# Patient Record
Sex: Female | Born: 1950 | Race: Black or African American | Hispanic: No | Marital: Single | Smoking: Never smoker
Health system: Southern US, Community
[De-identification: ages and names within clinical notes are randomized; demographics above are authoritative.]

## PROBLEM LIST (undated history)

## (undated) DIAGNOSIS — I10 Essential (primary) hypertension: Secondary | ICD-10-CM

## (undated) DIAGNOSIS — E669 Obesity, unspecified: Secondary | ICD-10-CM

## (undated) HISTORY — PX: CORNEAL TRANSPLANT: SHX108

---

## 1998-04-04 ENCOUNTER — Ambulatory Visit (HOSPITAL_COMMUNITY): Admission: RE | Admit: 1998-04-04 | Discharge: 1998-04-04 | Payer: Self-pay | Admitting: Internal Medicine

## 2010-10-12 ENCOUNTER — Encounter: Payer: Self-pay | Admitting: Internal Medicine

## 2013-02-13 ENCOUNTER — Encounter (HOSPITAL_COMMUNITY): Payer: Self-pay

## 2013-02-13 ENCOUNTER — Emergency Department (HOSPITAL_COMMUNITY)
Admission: EM | Admit: 2013-02-13 | Discharge: 2013-02-13 | Disposition: A | Discharge status: Nursing Home (Includes ICF) | Payer: BC Managed Care – PPO | Source: Home / Self Care

## 2013-02-13 ENCOUNTER — Emergency Department (HOSPITAL_COMMUNITY): Payer: BC Managed Care – PPO

## 2013-02-13 ENCOUNTER — Encounter (HOSPITAL_COMMUNITY): Payer: Self-pay | Admitting: *Deleted

## 2013-02-13 ENCOUNTER — Emergency Department (HOSPITAL_COMMUNITY)
Admission: EM | Admit: 2013-02-13 | Discharge: 2013-02-13 | Disposition: A | Discharge status: Home / Self Care | Payer: BC Managed Care – PPO | Attending: Emergency Medicine | Admitting: Emergency Medicine

## 2013-02-13 DIAGNOSIS — I10 Essential (primary) hypertension: Secondary | ICD-10-CM

## 2013-02-13 DIAGNOSIS — I160 Hypertensive urgency: Secondary | ICD-10-CM

## 2013-02-13 DIAGNOSIS — Z7982 Long term (current) use of aspirin: Secondary | ICD-10-CM | POA: Insufficient documentation

## 2013-02-13 DIAGNOSIS — Z885 Allergy status to narcotic agent status: Secondary | ICD-10-CM | POA: Insufficient documentation

## 2013-02-13 DIAGNOSIS — I4510 Unspecified right bundle-branch block: Secondary | ICD-10-CM

## 2013-02-13 DIAGNOSIS — N390 Urinary tract infection, site not specified: Secondary | ICD-10-CM | POA: Insufficient documentation

## 2013-02-13 DIAGNOSIS — E669 Obesity, unspecified: Secondary | ICD-10-CM | POA: Insufficient documentation

## 2013-02-13 HISTORY — DX: Obesity, unspecified: E66.9

## 2013-02-13 HISTORY — DX: Essential (primary) hypertension: I10

## 2013-02-13 LAB — URINALYSIS, ROUTINE W REFLEX MICROSCOPIC
Bilirubin Urine: NEGATIVE
Glucose, UA: NEGATIVE mg/dL
Hgb urine dipstick: NEGATIVE
Ketones, ur: 40 mg/dL — AB
Nitrite: NEGATIVE
Protein, ur: NEGATIVE mg/dL
Specific Gravity, Urine: 1.015 (ref 1.005–1.030)
Urobilinogen, UA: 1 mg/dL (ref 0.0–1.0)
pH: 6.5 (ref 5.0–8.0)

## 2013-02-13 LAB — BASIC METABOLIC PANEL
BUN: 8 mg/dL (ref 6–23)
CO2: 29 mEq/L (ref 19–32)
Calcium: 9.6 mg/dL (ref 8.4–10.5)
Chloride: 101 mEq/L (ref 96–112)
Creatinine, Ser: 0.79 mg/dL (ref 0.50–1.10)
GFR calc Af Amer: >90 mL/min (ref >90)
GFR calc non Af Amer: 87 mL/min — ABNORMAL LOW (ref >90)
Glucose, Bld: 101 mg/dL — ABNORMAL HIGH (ref 70–99)
Potassium: 3.7 mEq/L (ref 3.5–5.1)
Sodium: 141 mEq/L (ref 135–145)

## 2013-02-13 LAB — URINE MICROSCOPIC-ADD ON

## 2013-02-13 LAB — CBC WITH DIFFERENTIAL/PLATELET
Basophils Absolute: 0 10*3/uL (ref 0.0–0.1)
Basophils Relative: 0 % (ref 0–1)
Eosinophils Absolute: 0 10*3/uL (ref 0.0–0.7)
Eosinophils Relative: 0 % (ref 0–5)
HCT: 45.4 % (ref 36.0–46.0)
Hemoglobin: 15.5 g/dL — ABNORMAL HIGH (ref 12.0–15.0)
Lymphocytes Relative: 14 % (ref 12–46)
Lymphs Abs: 1 10*3/uL (ref 0.7–4.0)
MCH: 29.5 pg (ref 26.0–34.0)
MCHC: 34.1 g/dL (ref 30.0–36.0)
MCV: 86.3 fL (ref 78.0–100.0)
Monocytes Absolute: 0.5 10*3/uL (ref 0.1–1.0)
Monocytes Relative: 7 % (ref 3–12)
Neutro Abs: 5.5 10*3/uL (ref 1.7–7.7)
Neutrophils Relative %: 79 % — ABNORMAL HIGH (ref 43–77)
Platelets: 327 10*3/uL (ref 150–400)
RBC: 5.26 MIL/uL — ABNORMAL HIGH (ref 3.87–5.11)
RDW: 15.1 % (ref 11.5–15.5)
WBC: 6.9 10*3/uL (ref 4.0–10.5)

## 2013-02-13 MED ORDER — CIPROFLOXACIN HCL 250 MG PO TABS: 250.0000 mg | ORAL_TABLET | Freq: Two times a day (BID) | ORAL | Status: DC

## 2013-02-13 MED ORDER — CLONIDINE HCL 0.1 MG PO TABS
ORAL_TABLET | ORAL | Status: AC
  Filled 2013-02-13: qty 2

## 2013-02-13 MED ORDER — CLONIDINE HCL 0.1 MG PO TABS
0.2000 mg | ORAL_TABLET | Freq: Once | ORAL | Status: AC
  Administered 2013-02-13: 0.2 mg via ORAL

## 2013-02-13 MED ORDER — LISINOPRIL-HYDROCHLOROTHIAZIDE 20-12.5 MG PO TABS: 1.0000 | ORAL_TABLET | Freq: Every day | ORAL | Status: DC

## 2013-02-13 MED ORDER — SODIUM CHLORIDE 0.9 % IV SOLN
Freq: Once | INTRAVENOUS | Status: AC
  Administered 2013-02-13: 15:00:00 via INTRAVENOUS

## 2013-02-13 NOTE — ED Notes (Signed)
Report given to [facility]. 

## 2013-02-13 NOTE — ED Provider Notes (Signed)
Medical screening examination/treatment/procedure(s) were conducted as a shared visit with non-physician practitioner(s) and myself.  I personally evaluated the patient during the encounter.  No evidence of HTN emergency.  Will start BP Rx  Donnetta Hutching, MD 02/13/13 2351

## 2013-02-13 NOTE — ED Provider Notes (Signed)
History     CSN: [id number]  Arrival date & time 02/13/13  1517   First MD Initiated Contact with Patient 02/13/13 1531      Chief Complaint  Patient presents with  . Hypertension    (Consider location/radiation/quality/duration/timing/severity/associated sxs/prior treatment) HPI Patient presents to the emergency department with elevated blood pressure.  Patient, states she is not taking her blood pressure medicines at this time.  She states she had stopped taking them about one year ago.patient denies chest pain, shortness of breath, nausea, vomiting, dysuria, decreased urination, blurred vision, headache, fever, numbness, or weakness.  Patient, states, that she does have a primary care Dr. Patient denies any worsening or alleviating factors to her blood pressure.  Patient, states, that she's had some abdominal fullness, that is relieved with bowel movements in the past few weeks. Past Medical History  Diagnosis Date  . Hypertension   . Obesity     Past Surgical History  Procedure Laterality Date  . Corneal transplant      No family history on file.  History  Substance Use Topics  . Smoking status: Never Smoker   . Smokeless tobacco: Not on file  . Alcohol Use: No    OB History   Grav Para Term Preterm Abortions TAB SAB Ect Mult Living                  Review of Systems All other systems negative except as documented in the HPI. All pertinent positives and negatives as reviewed in the HPI. Allergies  Codeine  Home Medications   Current Outpatient Rx  Name  Route  Sig  Dispense  Refill  . aspirin 81 MG chewable tablet   Oral   Chew 81 mg by mouth daily.         Marland Kitchen PRESCRIPTION MEDICATION      Supposed to be taking lisinopril-HCTZ 20/12.5mg  (per [facility]); has not taken in > 1 yr           BP 217/80  Pulse 91  Temp(Src) 98.7 F (37.1 C) (Oral)  Resp 20  Ht 5\' 5"  (1.651 m)  Wt 295 lb (133.811 kg)  BMI 49.09 kg/m2  SpO2 99%  Physical Exam   Nursing note and vitals reviewed. Constitutional: She is oriented to person, place, and time. She appears well-developed and well-nourished. No distress.  HENT:  Head: Normocephalic and atraumatic.  Mouth/Throat: Oropharynx is clear and moist.  Eyes: Pupils are equal, round, and reactive to light.  Neck: Normal range of motion. Neck supple.  Cardiovascular: Normal rate, regular rhythm and normal heart sounds.  Exam reveals no gallop and no friction rub.   No murmur heard. Pulmonary/Chest: Effort normal and breath sounds normal. No respiratory distress.  Abdominal: Soft. Bowel sounds are normal. She exhibits no distension and no mass. There is no tenderness. There is no guarding.  Musculoskeletal: Normal range of motion.  Neurological: She is alert and oriented to person, place, and time. She exhibits normal muscle tone. Coordination normal.  Skin: Skin is warm and dry. No rash noted. No erythema.  Psychiatric: She has a normal mood and affect. Her behavior is normal. Judgment and thought content normal.    ED Course  Procedures (including critical care time)  Labs Reviewed  CBC WITH DIFFERENTIAL - Abnormal; Notable for the following:    RBC 5.26 (*)    Hemoglobin 15.5 (*)    Neutrophils Relative % 79 (*)    All other components within normal limits  BASIC METABOLIC PANEL - Abnormal; Notable for the following:    Glucose, Bld 101 (*)    GFR calc non Af Amer 87 (*)    All other components within normal limits  URINALYSIS, ROUTINE W REFLEX MICROSCOPIC - Abnormal; Notable for the following:    APPearance CLOUDY (*)    Ketones, ur 40 (*)    Leukocytes, UA LARGE (*)    All other components within normal limits  URINE MICROSCOPIC-ADD ON - Abnormal; Notable for the following:    Bacteria, UA FEW (*)    All other components within normal limits  URINE CULTURE   Dg Chest 2 View  02/13/2013   *RADIOLOGY REPORT*  Clinical Data: Current history of hypertension for which the patient has  run out of medications.  CHEST - 2 VIEW  Comparison: None.  Findings: Cardiac silhouette mildly to moderately enlarged.  Hilar and mediastinal contours otherwise unremarkable.  Mildly prominent bronchovascular markings diffusely and moderate central peribronchial thickening.  No confluent airspace consolidation. Normal pulmonary vascularity.  No pleural effusions.  Visualized bony thorax intact.  IMPRESSION: Mild to moderate cardiomegaly without pulmonary edema.  Moderate changes of bronchitis and/or asthma which may be acute or chronic.   Original Report Authenticated By: Hulan Saas, M.D.     Patient has no signs of hypertensive crisis or emergency. she will be treated for her blood pressure.  She is advised, that she'll need to followup with her primary care Dr.patient is advised to return here for any worsening in her condition.  Advised her we will treat for mild UTI.  Told to increase her fluid intake   MDM  MDM Reviewed: vitals and nursing note Interpretation: labs, ECG and x-ray   Date: 02/13/2013  Rate: 104  Rhythm: normal sinus rhythm  QRS Axis: normal  Intervals: normal  ST/T Wave abnormalities: normal  Conduction Disutrbances:right bundle branch block  Narrative Interpretation:   Old EKG Reviewed: unchanged            Carlyle Dolly, PA-C 02/13/13 1815

## 2013-02-13 NOTE — ED Notes (Addendum)
Patient arrived [facility] from [facility]. Pt presented to Urgent Care today with concerns due to not taking her medications for an extended amount of time. (approx 1 year or more) Pt reports feeling funny on Thursday (4 days ago). Patient denies CP, SOB, Nausea/Vomiting. Pt endorses "a full feeling in my stomach. I just knew it was BP related". Edema in feet.

## 2013-02-13 NOTE — ED Notes (Signed)
Patient is resting comfortably. 

## 2013-02-13 NOTE — ED Notes (Signed)
Patient transported to X-ray 

## 2013-02-13 NOTE — ED Provider Notes (Signed)
History     CSN: [id number]  Arrival date & time 02/13/13  1217   First MD Initiated Contact with Patient 02/13/13 1341      Chief Complaint  Patient presents with  . Hypertension    (Consider location/radiation/quality/duration/timing/severity/associated sxs/prior treatment) HPI Comments: See nurses notes.  Has been out of meds for a year.  Was on lisinopril 20mg  and two others but doesn't remember the name.  She has been taking 20mg  of her brothers lisinopril for the last 4 months.  Home bp readings the last two days have been 180s/90s.  States 3 days ago is when she started to feel funny and have the tingling.  Has been intermittent since then.  Patient is a 62 y.o. female presenting with hypertension. The history is provided by the patient.  Hypertension This is a chronic problem. Pertinent negatives include no chest pain, no abdominal pain (full ness sensation in epigastric area.  not a pressure but a fullness.), no headaches and no shortness of breath. Associated symptoms comments: Has not been feeling herself.  States overall not feeling right.  Has had some left side dnumbness in her left face and in her left arm.  Denies chest pain or neck or jaw pain or sob.  Feels light headed at times.  No nausea..    Past Medical History  Diagnosis Date  . Hypertension   . Obesity     Past Surgical History  Procedure Laterality Date  . Corneal transplant      No family history on file.  History  Substance Use Topics  . Smoking status: Never Smoker   . Smokeless tobacco: Not on file  . Alcohol Use: No    OB History   Grav Para Term Preterm Abortions TAB SAB Ect Mult Living                  Review of Systems  Respiratory: Negative for shortness of breath.   Cardiovascular: Negative for chest pain.  Gastrointestinal: Negative for abdominal pain (full ness sensation in epigastric area.  not a pressure but a fullness.).  Neurological: Negative for headaches.  All other  systems reviewed and are negative.    Allergies  Codeine  Home Medications   Current Outpatient Rx  Name  Route  Sig  Dispense  Refill  . PRESCRIPTION MEDICATION      Supposed to be taking lisinopril-HCTZ 20/12.5mg  (per [facility]); has not taken in > 1 yr           BP 204/118  Pulse 109  Temp(Src) 98.5 F (36.9 C) (Oral)  Resp 16  SpO2 100%  Physical Exam  Constitutional: She is oriented to person, place, and time. She appears well-developed and well-nourished. No distress.  HENT:  Head: Normocephalic and atraumatic.  Eyes: Conjunctivae and EOM are normal. Pupils are equal, round, and reactive to light.  Neck: Normal range of motion. Neck supple. No JVD present. No tracheal deviation present. No thyromegaly present.  Cardiovascular: Normal rate, regular rhythm and normal heart sounds.  Exam reveals no gallop and no friction rub.   No murmur heard. Bilateral lower extremity edema 3 plus  Pulmonary/Chest: Effort normal and breath sounds normal. No stridor. No respiratory distress. She has no wheezes. She has no rales. She exhibits no tenderness.  Abdominal: Soft. She exhibits no distension and no mass. There is no tenderness. There is no rebound and no guarding.  Musculoskeletal: Normal range of motion.  Lymphadenopathy:    She has  no cervical adenopathy.  Neurological: She is alert and oriented to person, place, and time. She has normal reflexes. She displays normal reflexes. No cranial nerve deficit. She exhibits normal muscle tone. Coordination normal.  Skin: Skin is warm and dry. No rash noted. She is not diaphoretic. No erythema. No pallor.    ED Course  Procedures (including critical care time)  Clonidine .2mg  given  EKG with RBBB  1. Right bundle branch block   2. Hypertensive urgency       MDM  New finding of RBBB on EKG of this hypertensive obese female who hasn't been on her antiypertensive meds for a year.  She has worrisome sx for ACS so  transfering to ED via [facility] for further evaluation.  She has been given clonidine .2mg  once here in UC.        Maryelizabeth Rowan, MD 02/13/13 [phone]

## 2013-02-13 NOTE — ED Notes (Signed)
[facility] notified of pt transport.  Lequita Halt, ED Charge RN notified of pt transfer.

## 2013-02-13 NOTE — ED Notes (Signed)
Denies any CP.  Denies any LUE numbness at present.  Sinus tachy in 100s with frequent multifocal PVCs.

## 2013-02-13 NOTE — ED Notes (Signed)
Reports running out of her HTN meds (3 of them total) approx a year ago; over the past few days, she has had BLE swelling and intermittent left arm numbness.  Denies any HA or vision changes.  Denies any CP or SOB.  Has occasional "feeling of fullness" in abd.  Tried to make appt with PCP on Fri, but office was closed.  Took 81mg  ASA this morning.  Had taken a couple of doses of brother's lisinopril over last few days.

## 2013-02-15 LAB — URINE CULTURE: Colony Count: >=100000

## 2013-02-16 NOTE — ED Notes (Signed)
Post ED Visit - Positive Culture Follow-up: Successful Patient Follow-Up  Culture assessed and recommendations reviewed by: []  Wes Dulaney, Pharm.D., BCPS []  Celedonio Miyamoto, Pharm.D., BCPS []  Georgina Pillion, Pharm.D., BCPS [x]  [place], [place].D., BCPS, AAHIVP []  Estella Husk, Pharm.D., BCPS, AAHIVP  Positive urine culture  []  Patient discharged without antimicrobial prescription and treatment is now indicated [x]  Organism is resistant to prescribed ED discharge antimicrobial []  Patient with positive blood cultures  Changes discussed with ED provider: Lemont Fillers New antibiotic prescription Amoxicillin 500 mg TID po x 5 days   Contacted patient Left voice mail message, date 5/28 time 1616  Larena Sox 02/16/2013, 4:14 PM

## 2013-02-17 NOTE — Progress Notes (Signed)
ED Antimicrobial Stewardship Positive Culture Follow Up   SHRADDHA LEBRON is an 62 y.o. female who presented to [facility] on 02/13/2013 with a chief complaint of  Chief Complaint  Patient presents with  . Hypertension    Recent Results (from the past 720 hour(s))  URINE CULTURE     Status: None   Collection Time    02/13/13  4:39 PM      Result Value Range Status   Specimen Description URINE, RANDOM   Final   Special Requests NONE   Final   Culture  Setup Time 02/13/2013 22:40   Final   Colony Count >=100,000 COLONIES/ML   Final   Culture     Final   Value: GROUP B STREP(S.AGALACTIAE)ISOLATED     Note: TESTING AGAINST S. AGALACTIAE NOT ROUTINELY PERFORMED DUE TO PREDICTABILITY OF AMP/PEN/VAN SUSCEPTIBILITY.   Report Status 02/15/2013 FINAL   Final    [x]  Treated with cipro, organism resistant to prescribed antimicrobial []  Patient discharged originally without antimicrobial agent and treatment is now indicated  New antibiotic prescription: Amoxicillin 500mg  TID x 5 days  ED Provider: Priscille Loveless 02/17/2013, 9:19 AM Infectious Diseases Pharmacist Phone# [phone]

## 2013-04-02 ENCOUNTER — Telehealth (HOSPITAL_COMMUNITY): Payer: Self-pay | Admitting: Emergency Medicine

## 2013-04-02 NOTE — ED Notes (Signed)
No response to letter sent after 30 days. Chart sent to Medical Records. °

## 2013-08-22 ENCOUNTER — Other Ambulatory Visit: Payer: Self-pay | Admitting: Family Medicine

## 2013-08-22 DIAGNOSIS — Z1231 Encounter for screening mammogram for malignant neoplasm of breast: Secondary | ICD-10-CM

## 2013-09-23 ENCOUNTER — Ambulatory Visit: Payer: BC Managed Care – PPO

## 2015-10-23 ENCOUNTER — Ambulatory Visit (INDEPENDENT_AMBULATORY_CARE_PROVIDER_SITE_OTHER): Payer: BC Managed Care – PPO | Admitting: Family Medicine

## 2015-10-23 ENCOUNTER — Encounter: Payer: Self-pay | Admitting: Family Medicine

## 2015-10-23 VITALS — Ht 65.0 in | Wt 335.4 lb

## 2015-10-23 DIAGNOSIS — E119 Type 2 diabetes mellitus without complications: Secondary | ICD-10-CM

## 2015-10-23 NOTE — Progress Notes (Signed)
Medical Nutrition Therapy:  Appt start time: 1100 end time:  1200.  Assessment:  Primary concerns today: Weight management and Blood sugar control.   Linda Huang is interested in weight loss and better BG control (referred by Dr. Duanne Guess).  She would like to be able to reduce med's intake; just started metformin last Dec.  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  It is a high-stress time in her office right now with the recent regulation changes for immigrants.    Learning Readiness: Ready  Usual eating pattern includes 1 meal and 4-6 snacks per day. Frequent foods and beverages include coffee with crmr & swtnr 2 c/day, sweet tea 12-20 oz 3-4 X wk, water, non-cola soda on weekends, apples, bananas 2-3 X wk, fried chx ~3 X wk, McD's 1-2 X wk, [employer] Johns tuna sub 1-2 X wk, stove top popcorn w/ olive oil + butter 5 X wk.  Avoided foods include donuts, most shellfish.   Usual physical activity includes none currently.  Walk to work is 8-15 min from parking lot.    24-hr recall: (Up at 7 AM) B (8 AM)-   1/2 c coffee, 1/2 tbsp creamer, 1 Equal Snk (11:30)-   1 apple L (3 PM)-  Jim[employer]'s 8" tuna sub, water Snk ( PM)-  --- D (7 PM)-  Chinese: 2 c chx&broc, 2 c rice, 1 egg roll  Snk ( PM)-  --- Typical day? Yes.  Except usually has more coffee.    Progress Towards Goal(s):  In progress.   Nutritional Diagnosis:  NI-5.8.2 Excessive carbohydrate intake As related to both foods and beverages.  As evidenced by self-report of usual intake of sweet tea as well as large portions of carb foods at both meals and snacks.    Intervention:  Nutrition education.  Handouts given during visit include:  AVS  Goals Sheet  Demonstrated degree of understanding via:  Teach Back  Barriers to learning/adherence to lifestyle change: Longstanding pattern of no consistent eating schedule.    Monitoring/Evaluation:  Dietary intake, exercise, and body weight in 4 week(s).

## 2015-10-23 NOTE — Patient Instructions (Addendum)
-   Please email [email or web address]:  Your medications, dosage, and when you take them.     Diet Recommendations for Diabetes  Carbohydrate includes STARCH, SUGAR, AND FIBER.  Only sugar and starch raise blood sugars and provide calories.  Humans cannot get any carb's or calories from fiber.    Starchy (carb) foods: Bread, rice, pasta, potatoes, corn, cereal, grits, crackers, bagels, muffins, all baked goods.  (Fruits, milk, and yogurt also have carbohydrate, but most of these foods will not spike your blood sugar as the starchy foods will.)  A few fruits do cause high blood sugars; use small portions of bananas (limit to 1/2 at a time), grapes, watermelon, oranges, and most tropical fruits.    Protein foods: Meat, fish, poultry, eggs, dairy foods, and beans such as pinto and kidney beans (beans also provide carbohydrate).   1. Eat at least 3 REAL meals and 1-2 snacks per day. Never go more than 4-5 hours while awake without eating. Eat breakfast within the first hour of getting up.   2. Limit starchy foods to TWO per meal and ONE per snack. ONE portion of a starchy  food is equal to the following:   - ONE slice of bread (or its equivalent, such as half of a hamburger bun).   - 1/2 cup of a "scoopable" starchy food such as potatoes or rice.   - 15 grams of Total Carbohydrate as shown on food label.   3. Include at every meal: a protein food, a carb food, and vegetables and/or fruit.   - Obtain twice the volume of veg's as protein or carbohydrate foods for both lunch and dinner.   - Fresh or frozen veg's are best.   - Keep frozen veg's on hand for a quick vegetable serving.     4. Walk at least 20 minutes 3 X wk (increasing to 4 times a week by the week of Mar 12).    - Our bodies respond differently to different carb foods, which we talk about as glycemic response.  Some carb foods have higher glycemic loads than others.   - Cereal:  Look for at least 5 grams of fiber per serving.  If  you like a particular cereal that is lower in fiber, mix in a high-fiber cereal with it, i.e., All Bran, Bran Buds. - Nuts and seeds: These foods are good for you, but it's smart to be cautious about portion size b/c they are loaded with fat (good quality fat, but high-calorie b/c of the fat).  Best is unsalted.

## 2015-11-19 ENCOUNTER — Encounter: Payer: Self-pay | Admitting: Family Medicine

## 2015-11-19 ENCOUNTER — Ambulatory Visit (INDEPENDENT_AMBULATORY_CARE_PROVIDER_SITE_OTHER): Payer: BC Managed Care – PPO | Admitting: Family Medicine

## 2015-11-19 VITALS — Ht 65.0 in | Wt 334.1 lb

## 2015-11-19 DIAGNOSIS — E119 Type 2 diabetes mellitus without complications: Secondary | ICD-10-CM

## 2015-11-19 NOTE — Patient Instructions (Addendum)
-   Vitamin D:  Ask Dr. Duanne Guess to retest your vit D level the next time you see her.    - Ask for the exact number from her, not just in or not in normal range.  Email it to [place] or bring to next appt.    - When you supplement, use D3.  IF you get a Rx from Dr. Duanne Guess, ask her to write it for D3 specifically.    - For now, feel free to take 09-1998 IU per day.   - Nutrition Facts Panel:  Most important is to look for TOTAL CARB, but also remember to check the serving size (compared to what you eat).   - Cereal:  Look for at least 5 grams of fiber per serving.   - Establish the habit of drinking water first thing in the morning AND carry your water bottle with you everywhere.   - Label reading:  Get familiar with the carb content of ANY carb food you eat pretty regularly.    Goals:  1. Include vegetables for both lunch and dinner at least 5 X wk.   2. Limit starchy foods to 2 per meal and 1 per snack.   3. Walk at least 20 min 3 X week.    Complete Goals Sheet, and bring to follow-up.    (Remember to also make a good effort to get at least REAL 3 meals (which includes protein, starch, and veg's and/or fruit) and 1-2 snack/day.)

## 2015-11-19 NOTE — Progress Notes (Signed)
Medical Nutrition Therapy:  Appt start time: 1100 end time:  1200.  Assessment:  Primary concerns today: Weight management and Blood sugar control.   Linda Huang says she is much more aware of food choices she is making.  Still not eating breakfast early, but has started to bring Raisin Bran and milk to work with her.  She has been walking 20-30 min 4-5 X wk, and already noticed less SOB already.  Estimated veg intake is <1 X day.  Also reading labels avidly, looking for Total Carb, Sugar, Protein, Fat, vit's and minerals, and sometimes kcal.  We reviewed food labels today, with suggestions for the most important items to look for rather than taking in everything.    Although Linda Huang is checking labels more often, she did not look at the carb content of Raisin Bran, which is 46 g of CHO/cup.  At a portion size of 2 cups, she is getting 92 g of CHO just from the cereal.      24-hr recall:  (Up at 7:45 AM; water when first got up) B ( AM)-  --- Snk ( AM)-  --- L (12 PM)-  1 baloney (2), mustard sandwich, water Snk ( PM)-  --- D ( PM)-  1/2 c turnip greens, [employer] fried chx brst, pickled okra, water Snk (9 PM)-  1/4 c dry popcorn in canola oil, water Typical day? Yes.  for a Sunday.  Weekdays differ in that usually eats ~2 c Raisin Bran and ~1/2 c 2% milk with 1 c coffee at 10-11:30 AM; afternoon snack of fruit or almonds; [employer] caf[employer] 2-3 X wk for dinner veg's and protein source.  She is not getting veg's twice a day on any day.    Progress Towards Goal(s):  In progress.   Nutritional Diagnosis:  NI-5.8.2 Excessive carbohydrate intake As related to both foods and beverages.  As evidenced by self-report of usual intake of sweet tea as well as large portions of carb foods at both meals and snacks.    Intervention:  Nutrition education.  Handouts given during visit include:  AVS  Revised Goals Sheet  Demonstrated degree of understanding via:  Teach Back  Barriers to  learning/adherence to lifestyle change: Longstanding pattern of no consistent eating schedule.    Monitoring/Evaluation:  Dietary intake, exercise, and body weight in 4 week(s).

## 2015-12-28 ENCOUNTER — Telehealth: Payer: Self-pay | Admitting: *Deleted

## 2015-12-28 NOTE — Telephone Encounter (Signed)
Patient called to cancel appointment with Dr. Gerilyn PilgrimSykes on 4/10. Would like Dr. Gerilyn PilgrimSykes to give her a call about rescheduling.

## 2015-12-31 ENCOUNTER — Ambulatory Visit: Payer: BC Managed Care – PPO | Admitting: Family Medicine

## 2016-01-21 ENCOUNTER — Ambulatory Visit: Payer: BC Managed Care – PPO | Admitting: Family Medicine

## 2017-09-17 ENCOUNTER — Encounter: Payer: Self-pay | Admitting: Family Medicine

## 2018-10-06 ENCOUNTER — Other Ambulatory Visit: Payer: Self-pay | Admitting: Family Medicine

## 2018-10-06 DIAGNOSIS — N63 Unspecified lump in unspecified breast: Secondary | ICD-10-CM

## 2018-10-06 DIAGNOSIS — R5381 Other malaise: Secondary | ICD-10-CM

## 2018-11-02 ENCOUNTER — Ambulatory Visit
Admission: RE | Admit: 2018-11-02 | Discharge: 2018-11-02 | Disposition: A | Discharge status: Home / Self Care | Payer: BC Managed Care – PPO | Source: Ambulatory Visit | Attending: Family Medicine | Admitting: Family Medicine

## 2018-11-02 DIAGNOSIS — N63 Unspecified lump in unspecified breast: Secondary | ICD-10-CM

## 2019-12-11 IMAGING — MG DIGITAL DIAGNOSTIC BILATERAL MAMMOGRAM WITH TOMO AND CAD
8 series · 8 of 24 positions shown · non-contrast
Comparison: Previous exam(s).

ACR Breast Density Category a: The breast tissue is almost entirely
fatty.

CLINICAL DATA: 68-year-old female with palpable lump in the LOWER
INNER LEFT breast discovered on clinical examination. Also for new
baseline mammogram.

EXAM:
DIGITAL DIAGNOSTIC BILATERAL MAMMOGRAM WITH CAD AND TOMO
ULTRASOUND LEFT BREAST

[R MLO synth-2D]
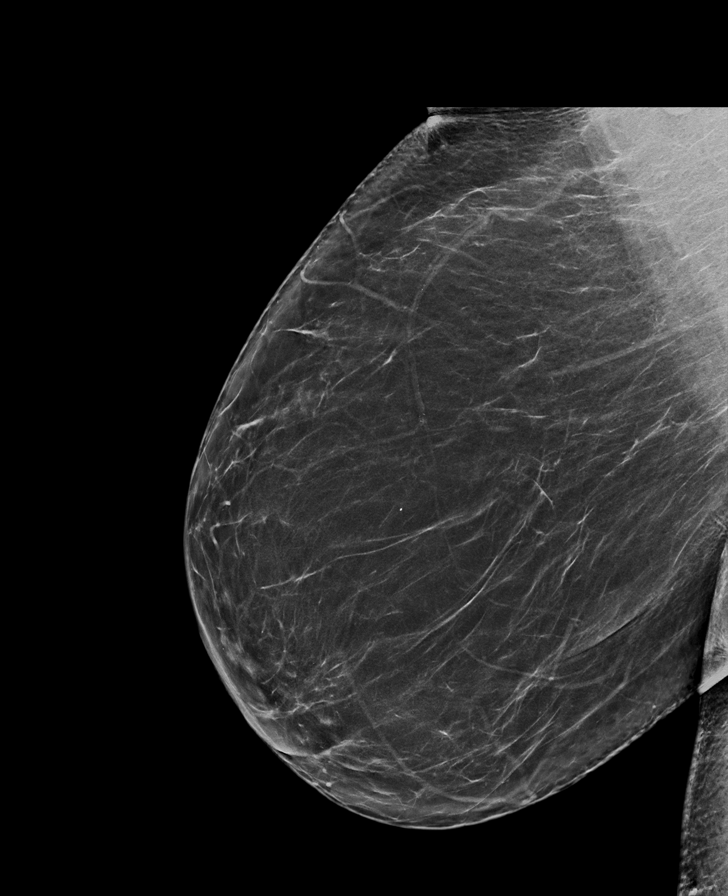

[L MLO synth-2D]
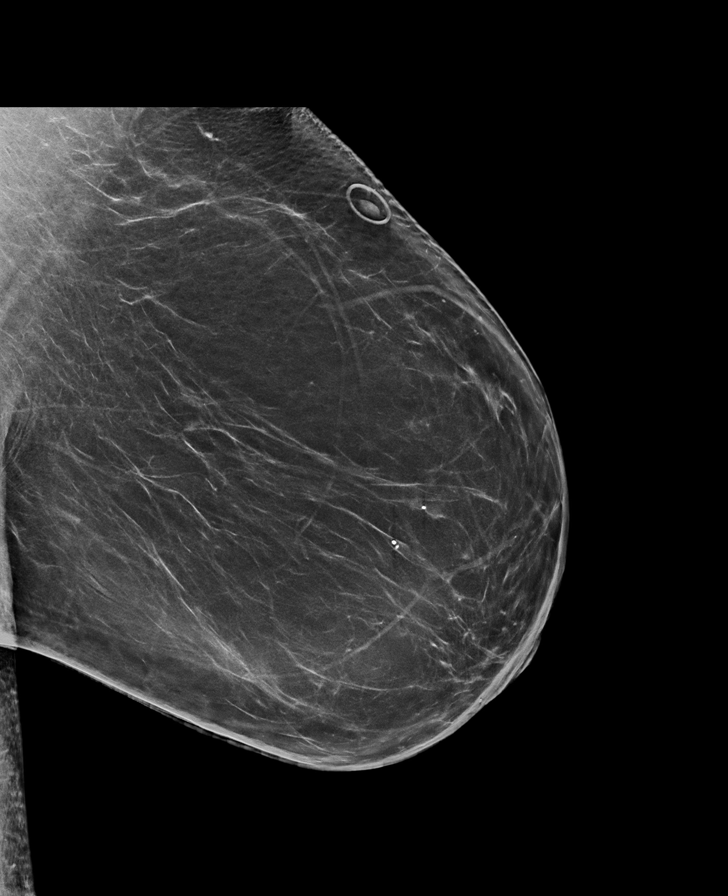

[L CC synth-2D]
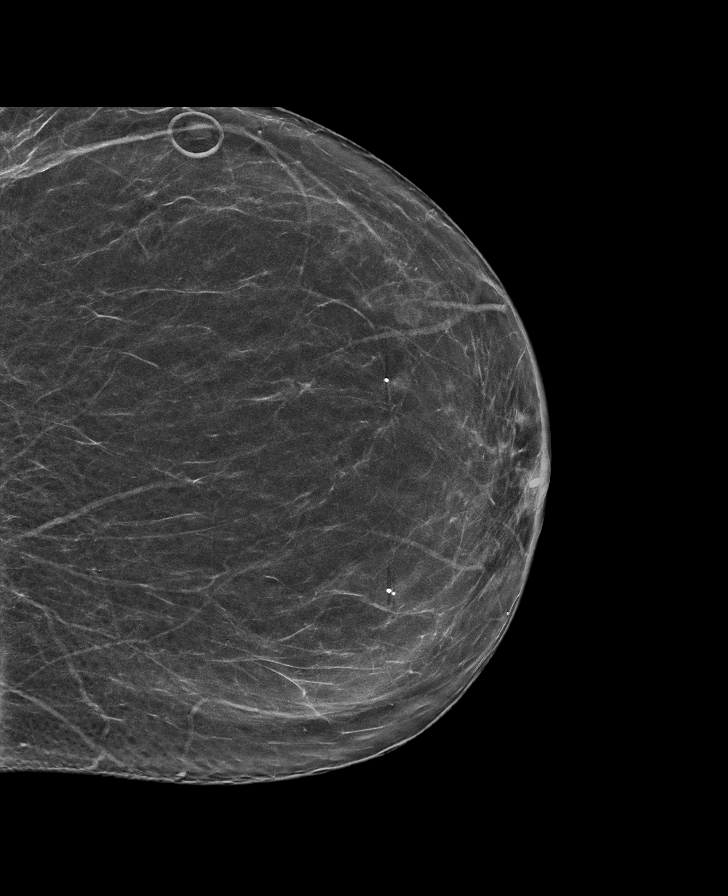

[R CC synth-2D]
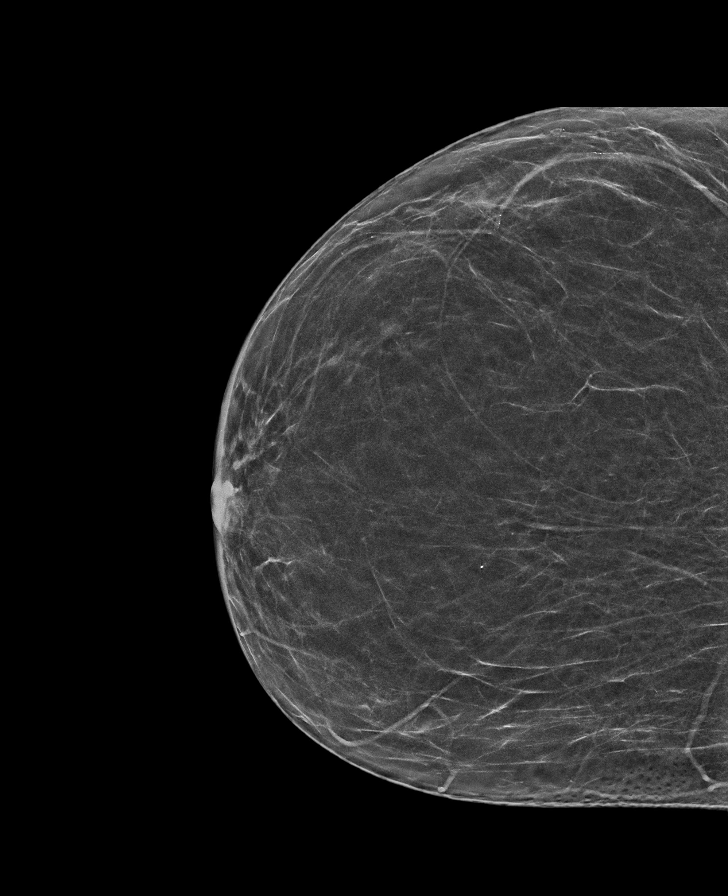

[R MLO tomo · tomo slice 44/87.0]
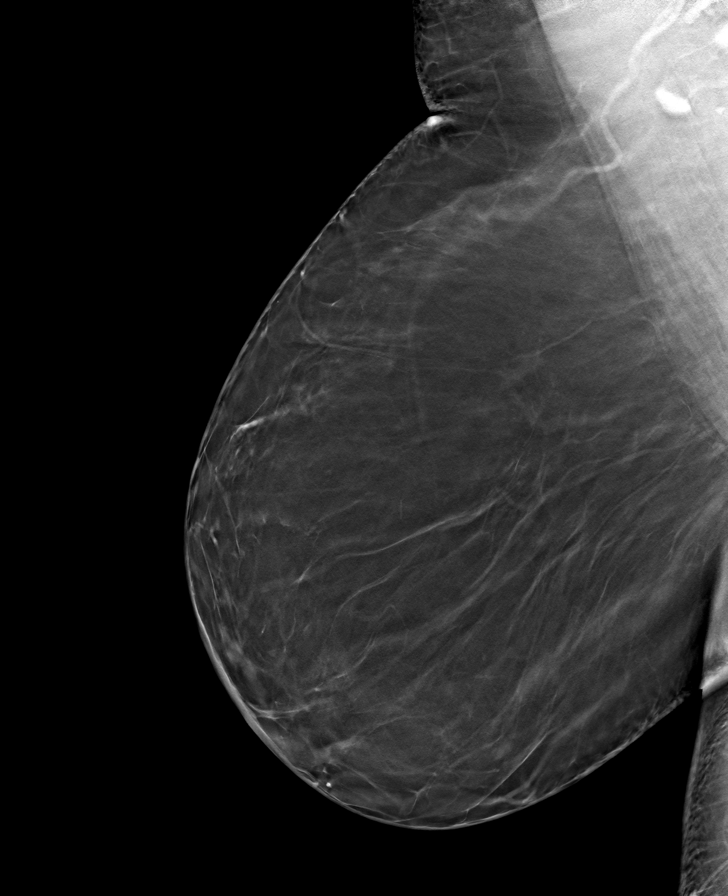

[R CC tomo · tomo slice 33/66.0]
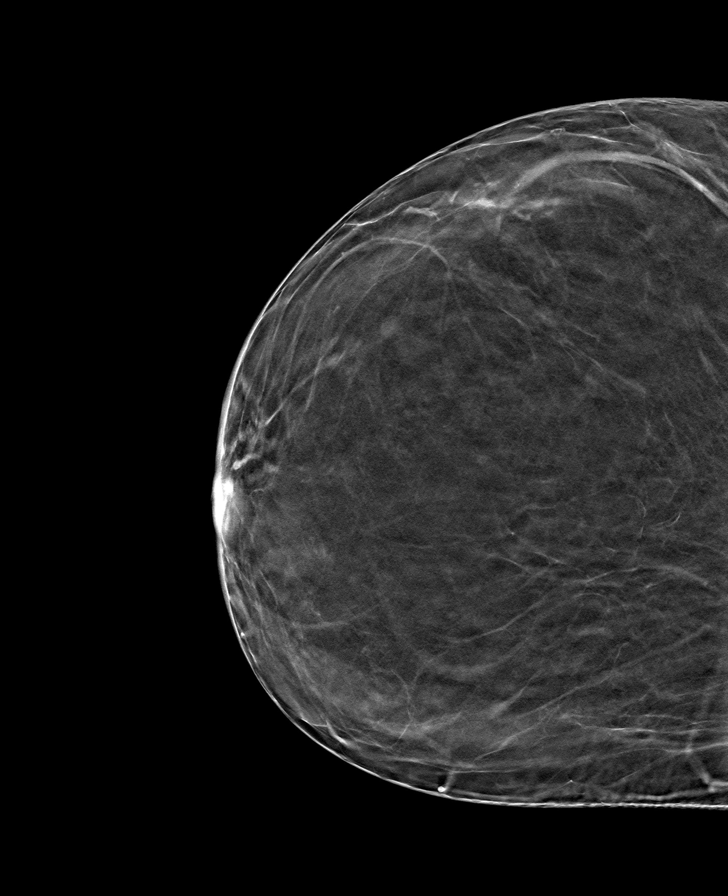

[L CC tomo · tomo slice 37/72.0]
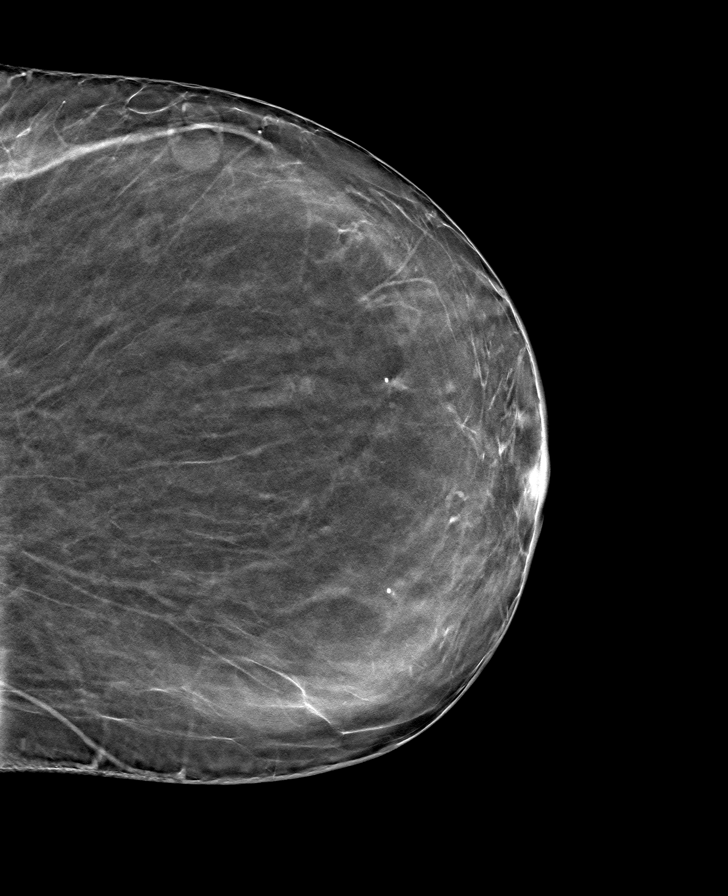

[L MLO tomo · tomo slice 47/92.0]
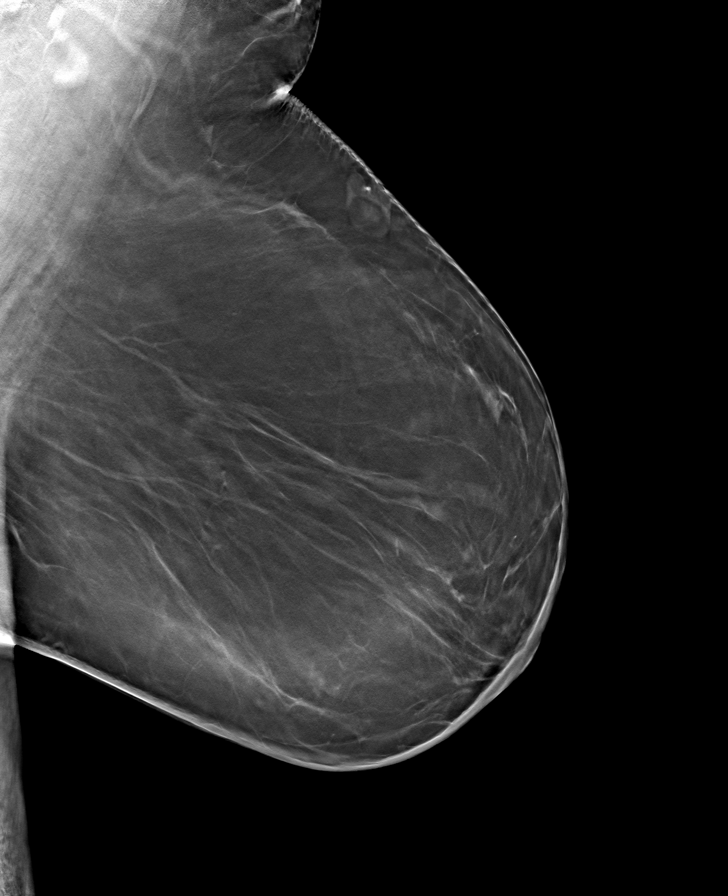

[8 of 24 positions shown; findings below may reference images not displayed]

FINDINGS: 2D/3D full field views of both breasts demonstrate no suspicious
mass, distortion or worrisome calcifications.

Mammographic images were processed with CAD.

Targeted ultrasound is performed, showing no solid or cystic mass,
distortion or abnormal shadowing within the LOWER or INNER LEFT
breast.
IMPRESSION: 1. No mammographic or sonographic abnormality within the LOWER or
INNER LEFT breast, in the area of clinical concern.
2. No mammographic evidence of breast malignancy.

RECOMMENDATION:
Bilateral screening mammogram in 1 year

I have discussed the findings and recommendations with the patient.
Results were also provided in writing at the conclusion of the
visit. If applicable, a reminder letter will be sent to the patient
regarding the next appointment.

BI-RADS CATEGORY  1: Negative.

## 2019-12-24 ENCOUNTER — Ambulatory Visit: Payer: BC Managed Care – PPO | Attending: Family

## 2019-12-24 DIAGNOSIS — Z23 Encounter for immunization: Secondary | ICD-10-CM

## 2019-12-24 NOTE — Progress Notes (Signed)
   Covid-19 Vaccination Clinic  Name:  Linda Huang    MRN: [id number] DOB: 06-13-1951  12/24/2019  Linda Huang was observed post Covid-19 immunization for 15 minutes without incident. She was provided with Vaccine Information Sheet and instruction to access the V-Safe system.   Linda Huang was instructed to call 911 with any severe reactions post vaccine: Marland Kitchen Difficulty breathing  . Swelling of face and throat  . A fast heartbeat  . A bad rash all over body  . Dizziness and weakness   Immunizations Administered    Name Date Dose VIS Date Route   JANSSEN COVID-19 VACCINE 12/24/2019 12:34 PM 0.5 mL 11/19/2019 Intramuscular   Manufacturer: [employer]   Lot: [id number]   NDC: 62947-654-65

## 2020-10-23 DEATH — deceased
